# Patient Record
Sex: Male | Born: 1955 | Race: White | Hispanic: No | Marital: Married | Smoking: Former smoker
Health system: Southern US, Community
[De-identification: ages and names within clinical notes are randomized; demographics above are authoritative.]

## PROBLEM LIST (undated history)

## (undated) DIAGNOSIS — S8292XA Unspecified fracture of left lower leg, initial encounter for closed fracture: Secondary | ICD-10-CM

## (undated) DIAGNOSIS — I10 Essential (primary) hypertension: Secondary | ICD-10-CM

## (undated) DIAGNOSIS — K429 Umbilical hernia without obstruction or gangrene: Secondary | ICD-10-CM

## (undated) DIAGNOSIS — E785 Hyperlipidemia, unspecified: Secondary | ICD-10-CM

## (undated) DIAGNOSIS — R011 Cardiac murmur, unspecified: Secondary | ICD-10-CM

## (undated) HISTORY — PX: APPENDECTOMY: SHX54

## (undated) HISTORY — DX: Hyperlipidemia, unspecified: E78.5

## (undated) HISTORY — DX: Cardiac murmur, unspecified: R01.1

## (undated) HISTORY — DX: Essential (primary) hypertension: I10

## (undated) HISTORY — DX: Unspecified fracture of left lower leg, initial encounter for closed fracture: S82.92XA

## (undated) HISTORY — DX: Umbilical hernia without obstruction or gangrene: K42.9

---

## 2010-11-21 ENCOUNTER — Encounter: Payer: Self-pay | Admitting: Cardiology

## 2010-11-21 ENCOUNTER — Ambulatory Visit: Payer: Self-pay | Admitting: Cardiology

## 2010-12-21 ENCOUNTER — Encounter: Payer: Self-pay | Admitting: Cardiology

## 2011-04-17 ENCOUNTER — Ambulatory Visit: Payer: Self-pay | Admitting: Cardiology

## 2011-04-30 ENCOUNTER — Encounter: Payer: Self-pay | Admitting: Cardiology

## 2011-05-01 ENCOUNTER — Ambulatory Visit (INDEPENDENT_AMBULATORY_CARE_PROVIDER_SITE_OTHER): Payer: Private Health Insurance - Indemnity | Admitting: Cardiology

## 2011-05-01 ENCOUNTER — Encounter: Payer: Self-pay | Admitting: Cardiology

## 2011-05-01 VITALS — BP 210/81 | HR 71 | Resp 18 | Ht 70.0 in | Wt 259.1 lb

## 2011-05-01 DIAGNOSIS — I779 Disorder of arteries and arterioles, unspecified: Secondary | ICD-10-CM | POA: Insufficient documentation

## 2011-05-01 DIAGNOSIS — R0989 Other specified symptoms and signs involving the circulatory and respiratory systems: Secondary | ICD-10-CM

## 2011-05-01 DIAGNOSIS — I10 Essential (primary) hypertension: Secondary | ICD-10-CM

## 2011-05-01 DIAGNOSIS — R011 Cardiac murmur, unspecified: Secondary | ICD-10-CM

## 2011-05-01 DIAGNOSIS — E782 Mixed hyperlipidemia: Secondary | ICD-10-CM

## 2011-05-01 NOTE — Assessment & Plan Note (Signed)
Long-standing by patient report, not treated medically until just recently. We discussed diet, weight loss, sodium restriction. He reports compliance with recent addition of Norvasc. We will follow up on his blood pressure at next visit.

## 2011-05-01 NOTE — Assessment & Plan Note (Signed)
Reportedly long-standing, although has not had regular followup or a prior echocardiogram. No definite symptoms or functional limitations from a cardiac perspective. He does have significant hypertension and probable LVH, could be a flow murmur. Plan will be to arrange a formal echocardiogram for further evaluation.

## 2011-05-01 NOTE — Patient Instructions (Signed)
Follow up as scheduled. Your physician recommends that you continue on your current medications as directed. Please refer to the Current Medication list given to you today. Your physician has requested that you have an echocardiogram. Echocardiography is a painless test that uses sound waves to create images of your heart. It provides your doctor with information about the size and shape of your heart and how well your heart's chambers and valves are working. This procedure takes approximately one hour. There are no restrictions for this procedure. Your physician has requested that you have a carotid duplex. This test is an ultrasound of the carotid arteries in your neck. It looks at blood flow through these arteries that supply the brain with blood. Allow one hour for this exam. There are no restrictions or special instructions.

## 2011-05-01 NOTE — Progress Notes (Signed)
   Clinical Summary Eric Vincent is a 56 y.o.male referred for cardiology consultation by Dr. Christell Vincent for evaluation of a heart murmur. He has had no regular medical followup for 30 years, recently established primary care evaluation, placed on antihypertensive as well as cholesterol-lowering medication. Main complaint in January was of a gout flare.  Recent lab work from January showed hemoglobin 16.2, platelets 270, potassium 4.2, BUN 21, creatinine 1.0, AST 34, ALT 58, cholesterol 230, nitroglycerin 121, HDL 37, LDL 169, uric acid 8.0.  He remembers being told that he had a heart murmur as a child, however states that this ultimately resolved. He has never had an echocardiogram. He reports no exertional chest pain or palpitations, describes NYHA class II dyspnea on exertion. No syncope. He states he sometimes gets "lightheaded" when he gets emotionally upset. This has been less of a problem recently.  He reports compliance with his recent medications. Also takes an aspirin daily.   Allergies  Allergen Reactions  . Tylenol Arthritis Ext (Acetaminophen) Rash    The capsule type makes pt breakout into a rash all over.    Current Outpatient Prescriptions  Medication Sig Dispense Refill  . allopurinol (ZYLOPRIM) 100 MG tablet Take 100 mg by mouth daily.        Marland Kitchen AMLODIPINE BESYLATE PO Take 10 mg by mouth daily.      . colchicine 0.6 MG tablet Take 0.6 mg by mouth daily.        . rosuvastatin (CRESTOR) 10 MG tablet Take 10 mg by mouth daily.        Past Medical History  Diagnosis Date  . Gout   . Essential hypertension, benign     Longstanding - untreated  . Hyperlipidemia     Recently diagnosed  . Systolic murmur     Diagnosed childhood  . Leg fracture, left   . Umbilical hernia     Past Surgical History  Procedure Date  . Appendectomy     Family History  Problem Relation Age of Onset  . Hypertension Mother   . Hypertension Father   . Hypertension Brother   . Anuerysm Father       Brain    Social History Eric Vincent reports that he has quit smoking. His smoking use included Cigarettes. He has never used smokeless tobacco. Eric Vincent reports that he does not drink alcohol.  Review of Systems As outlined above. No pain from umbilical hernia. No orthopnea or PND. No focal weakness or speech deficits. Has had chronic left leg pain following a fracture many years ago. Also occasionally swelling on that side. Otherwise negative.  Physical Examination Filed Vitals:   05/01/11 0848  BP: 210/81  Pulse: 71  Resp: 18   Morbidly obese male in no acute distress. HEENT: Conjunctiva and lids normal, oropharynx clear with poor dentition.. Neck: Supple, no elevated JVP, bilateral carotid bruits, no thyromegaly. Lungs: Clear to auscultation, nonlabored breathing at rest. Cardiac: Regular rate and rhythm, no S3, 2/6 systolic murmur heard throughout precordium, no pericardial rub. Abdomen: Protuberant, soft, nontender, umbilical hernia noted - large and nontender, bowel sounds present, no guarding or rebound. Extremities: Mild edema on left, distal pulses 2+. Skin: Warm and dry. Musculoskeletal: No kyphosis. Neuropsychiatric: Alert and oriented x3, affect grossly appropriate.   ECG Sinus rhythm at 61 with left atrial enlargement, LVH, nonspecific ST-T changes with anteroseptal and high lateral T-wave inversions.    Problem List and Plan

## 2011-05-01 NOTE — Assessment & Plan Note (Signed)
Weight loss was discussed  

## 2011-05-01 NOTE — Assessment & Plan Note (Signed)
Carotid Dopplers to be obtained as well. He is on aspirin daily.

## 2011-05-01 NOTE — Assessment & Plan Note (Signed)
Recently started on Crestor 

## 2011-05-15 ENCOUNTER — Other Ambulatory Visit: Payer: Private Health Insurance - Indemnity

## 2011-05-23 ENCOUNTER — Encounter (INDEPENDENT_AMBULATORY_CARE_PROVIDER_SITE_OTHER): Payer: Private Health Insurance - Indemnity

## 2011-05-23 DIAGNOSIS — R0989 Other specified symptoms and signs involving the circulatory and respiratory systems: Secondary | ICD-10-CM

## 2011-05-23 DIAGNOSIS — I6529 Occlusion and stenosis of unspecified carotid artery: Secondary | ICD-10-CM

## 2011-05-23 MED ORDER — LISINOPRIL-HYDROCHLOROTHIAZIDE 20-25 MG PO TABS: 1.0000 | ORAL_TABLET | Freq: Every morning | ORAL | Status: DC

## 2011-05-23 NOTE — Patient Instructions (Signed)
   Begin Lisinopril / HCTZ 20/25mg  every morning  Continue Norvasc every evening Your physician recommends that you go to the [facility] for lab work for [employer] prior to already scheduled office visit.

## 2011-05-29 ENCOUNTER — Other Ambulatory Visit: Payer: Self-pay

## 2011-05-29 ENCOUNTER — Ambulatory Visit (INDEPENDENT_AMBULATORY_CARE_PROVIDER_SITE_OTHER): Payer: Private Health Insurance - Indemnity | Admitting: *Deleted

## 2011-05-29 ENCOUNTER — Telehealth: Payer: Self-pay | Admitting: *Deleted

## 2011-05-29 ENCOUNTER — Other Ambulatory Visit (INDEPENDENT_AMBULATORY_CARE_PROVIDER_SITE_OTHER): Payer: Private Health Insurance - Indemnity

## 2011-05-29 VITALS — BP 203/89 | HR 65

## 2011-05-29 DIAGNOSIS — I10 Essential (primary) hypertension: Secondary | ICD-10-CM

## 2011-05-29 DIAGNOSIS — R011 Cardiac murmur, unspecified: Secondary | ICD-10-CM

## 2011-05-29 NOTE — Telephone Encounter (Signed)
Dr. Ival Bible recommendation following BP check this am.    Nona Dell, MD 05/29/2011 10:28 AM Pended  Continue same regimen for now, and arrange office followup for further titration.

## 2011-05-29 NOTE — Telephone Encounter (Signed)
No working number in Audiological scientist system for pt. Called [facility]-Dr. Vinicius Brockman's office to verify correct phone number. [phone]  This is correct number for pt. Pt's home and cell numbers have been corrected in Epic.   Pt notified of results of carotid doppler and Dr. Ival Bible review of nurse visit and verbalized understanding.

## 2011-05-29 NOTE — Progress Notes (Signed)
Pt presents to office for Echo today. He was seen last week for a Carotid doppler. At that time, he was found to have elevated BP and Lisinopril/HCTZ was added to pt's medications. BP remains elevated today. Pt took medications about 1 1/2 hours ago.

## 2011-05-29 NOTE — Progress Notes (Signed)
Pt notified and verbalized understanding.

## 2011-05-29 NOTE — Telephone Encounter (Signed)
Message copied by Arlyss Gandy on Wed May 29, 2011  3:39 PM ------      Message from: MCDOWELL, Illene Bolus      Created: Wed May 29, 2011 11:55 AM       Reviewed report. 40-59% bilateral ICA stenosis noted. Can continue medical therapy at this point.

## 2011-05-29 NOTE — Progress Notes (Signed)
Continue same regimen for now, and arrange office followup for further titration.

## 2011-06-05 ENCOUNTER — Ambulatory Visit (INDEPENDENT_AMBULATORY_CARE_PROVIDER_SITE_OTHER): Payer: Private Health Insurance - Indemnity | Admitting: Cardiology

## 2011-06-05 ENCOUNTER — Encounter: Payer: Self-pay | Admitting: Cardiology

## 2011-06-05 VITALS — BP 208/83 | HR 78 | Ht 70.0 in | Wt 252.0 lb

## 2011-06-05 DIAGNOSIS — R0989 Other specified symptoms and signs involving the circulatory and respiratory systems: Secondary | ICD-10-CM

## 2011-06-05 DIAGNOSIS — R011 Cardiac murmur, unspecified: Secondary | ICD-10-CM

## 2011-06-05 DIAGNOSIS — E782 Mixed hyperlipidemia: Secondary | ICD-10-CM

## 2011-06-05 DIAGNOSIS — I10 Essential (primary) hypertension: Secondary | ICD-10-CM

## 2011-06-05 MED ORDER — LISINOPRIL 40 MG PO TABS: 40.0000 mg | ORAL_TABLET | Freq: Every day | ORAL | Status: DC

## 2011-06-05 MED ORDER — HYDROCHLOROTHIAZIDE 25 MG PO TABS: 25.0000 mg | ORAL_TABLET | Freq: Every day | ORAL | Status: DC

## 2011-06-05 NOTE — Assessment & Plan Note (Signed)
No major regurgitant or stenotic valvular abnormalities. Does have mild thickening of the aortic and mitral valves.

## 2011-06-05 NOTE — Assessment & Plan Note (Signed)
Nonobstructive disease. Continue statin therapy and observation. Can followup Dopplers in approximately one year.

## 2011-06-05 NOTE — Assessment & Plan Note (Signed)
Continue to work on diet, sodium restriction, weight loss. Increase lisinopril to 40 mg daily, continue current dose of HCTZ and Norvasc. He will be following up with his primary care provider later this month, and we will see him back in the office as well. May need further medication titration.

## 2011-06-05 NOTE — Patient Instructions (Signed)
Follow up in 6 weeks. Stop Zestoretic (lisinopril/HCTZ combination pill). Start Lisinopril 40 mg and HCTZ 25 mg daily.

## 2011-06-05 NOTE — Assessment & Plan Note (Signed)
Patient continues on Crestor.

## 2011-06-05 NOTE — Progress Notes (Signed)
   Clinical Summary Eric Vincent is a 56 y.o.male presenting for followup. He was in February for evaluation of a cardiac murmur, also noted to be placed on medical therapy recently for hypertension, findings of carotid bruits on exam.  Echocardiogram revealed LVEF of 60-65% with moderate LVH, grade 1 diastolic dysfunction, and thickening of the aortic and mitral valve although without any stenotic or regurgitant lesions. Carotid Doppler showed 40-59% bilateral ICA stenoses. We reviewed the studies today.  His blood pressure was also noted to be significantly elevated in the interim prompting addition of medical treatment. Followup lab work on 4/1 showed BUN 34, creatinine 1.1, and potassium 4.2.  He is here with his wife today. Blood pressure is still not well controlled. He reports tolerating his current medications. He states that he has been working on diet and weight loss, has lost 7 pounds since last visit.   Allergies  Allergen Reactions  . Tylenol Arthritis Ext (Acetaminophen) Rash    The capsule type makes pt breakout into a rash all over.    Current Outpatient Prescriptions  Medication Sig Dispense Refill  . allopurinol (ZYLOPRIM) 100 MG tablet Take 100 mg by mouth daily.        Marland Kitchen AMLODIPINE BESYLATE PO Take 10 mg by mouth every evening.       . colchicine 0.6 MG tablet Take 0.6 mg by mouth daily.        . rosuvastatin (CRESTOR) 10 MG tablet Take 10 mg by mouth daily.      . hydrochlorothiazide (HYDRODIURIL) 25 MG tablet Take 1 tablet (25 mg total) by mouth daily.  30 tablet  6  . lisinopril (PRINIVIL,ZESTRIL) 40 MG tablet Take 1 tablet (40 mg total) by mouth daily.  30 tablet  6    Past Medical History  Diagnosis Date  . Gout   . Essential hypertension, benign     Longstanding - untreated  . Hyperlipidemia     Recently diagnosed  . Systolic murmur     Diagnosed childhood  . Leg fracture, left   . Umbilical hernia     Social History [name] reports that he has quit  smoking. His smoking use included Cigarettes. He has never used smokeless tobacco. Mr. Kohles reports that he does not drink alcohol.  Review of Systems Otherwise reviewed and negative.  Physical Examination Filed Vitals:   06/05/11 1400  BP: 208/83  Pulse: 78   Morbidly obese male in no acute distress.  HEENT: Conjunctiva and lids normal, oropharynx clear with poor dentition..  Neck: Supple, no elevated JVP, bilateral carotid bruits, no thyromegaly.  Lungs: Clear to auscultation, nonlabored breathing at rest.  Cardiac: Regular rate and rhythm, no S3, 2/6 systolic murmur heard throughout precordium, no pericardial rub.  Abdomen: Protuberant, soft, nontender, umbilical hernia noted - large and nontender, bowel sounds present, no guarding or rebound.  Extremities: Mild edema on left, distal pulses 2+.  Skin: Warm and dry.  Musculoskeletal: No kyphosis.  Neuropsychiatric: Alert and oriented x3, affect grossly appropriate.    Problem List and Plan

## 2011-07-19 ENCOUNTER — Ambulatory Visit: Payer: Private Health Insurance - Indemnity | Admitting: Cardiology

## 2011-08-30 ENCOUNTER — Encounter: Payer: Self-pay | Admitting: Cardiology

## 2011-08-30 ENCOUNTER — Ambulatory Visit (INDEPENDENT_AMBULATORY_CARE_PROVIDER_SITE_OTHER): Payer: Private Health Insurance - Indemnity | Admitting: Cardiology

## 2011-08-30 VITALS — BP 164/79 | HR 65 | Ht 70.0 in | Wt 263.0 lb

## 2011-08-30 DIAGNOSIS — I10 Essential (primary) hypertension: Secondary | ICD-10-CM

## 2011-08-30 DIAGNOSIS — R0989 Other specified symptoms and signs involving the circulatory and respiratory systems: Secondary | ICD-10-CM

## 2011-08-30 DIAGNOSIS — I779 Disorder of arteries and arterioles, unspecified: Secondary | ICD-10-CM

## 2011-08-30 DIAGNOSIS — E782 Mixed hyperlipidemia: Secondary | ICD-10-CM

## 2011-08-30 DIAGNOSIS — R011 Cardiac murmur, unspecified: Secondary | ICD-10-CM

## 2011-08-30 NOTE — Assessment & Plan Note (Signed)
Consistent with underlying LVH and also aortic sclerosis. No other major valvular abnormalities.

## 2011-08-30 NOTE — Assessment & Plan Note (Signed)
Decrease Crestor to 5 mg daily to see if he tolerates this better. Will request a recent lipid profile from primary care.

## 2011-08-30 NOTE — Assessment & Plan Note (Signed)
Mild to moderate. Continue aspirin, statin therapy, followup carotid Doppler for next visit in 6 months.

## 2011-08-30 NOTE — Progress Notes (Signed)
Clinical Summary Eric Vincent is a 56 y.o.male presenting for followup. He was seen back in February. He states that he feels better in general. He has gained some weight, and we discussed diet measures and also regular exercise. He reports compliance with his medications, and his blood pressure trend is looking better.  Echocardiogram from March showed moderate LVH with LVEF of 60-65%, grade 1 diastolic dysfunction, mildly thickened aortic valve with no major abnormalities. Carotid Doppler showed 40-59% bilateral ICA stenoses. LDL was 169 back in January. We reviewed this today.  He states he has had some "achiness" on Crestor. I suggested that he might see if he can tolerate 5 mg daily instead. Will also request his lipid profile from [facility].  Also reports intermittent leg cramps, not claudication however.   Allergies  Allergen Reactions  . Tylenol Arthritis Ext (Acetaminophen) Rash    The capsule type makes pt breakout into a rash all over.    Current Outpatient Prescriptions  Medication Sig Dispense Refill  . allopurinol (ZYLOPRIM) 100 MG tablet Take 100 mg by mouth daily.        Marland Kitchen AMLODIPINE BESYLATE PO Take 10 mg by mouth every evening.       Marland Kitchen aspirin EC 81 MG tablet Take 81 mg by mouth daily.      . colchicine 0.6 MG tablet Take 0.6 mg by mouth daily.        . hydrochlorothiazide (HYDRODIURIL) 25 MG tablet Take 1 tablet (25 mg total) by mouth daily.  30 tablet  6  . lisinopril (PRINIVIL,ZESTRIL) 40 MG tablet Take 1 tablet (40 mg total) by mouth daily.  30 tablet  6  . rosuvastatin (CRESTOR) 10 MG tablet Take 10 mg by mouth daily.        Past Medical History  Diagnosis Date  . Gout   . Essential hypertension, benign     Longstanding - untreated  . Hyperlipidemia     Recently diagnosed  . Systolic murmur     Diagnosed childhood  . Leg fracture, left   . Umbilical hernia     Social History Eric Vincent reports that he has quit smoking. His smoking  use included Cigarettes. He has never used smokeless tobacco. Eric Vincent reports that he does not drink alcohol.  Review of Systems Negative except as outlined above.  Physical Examination Filed Vitals:   08/30/11 0832  BP: 164/79  Pulse: 65    Morbidly obese male in no acute distress.  HEENT: Conjunctiva and lids normal, oropharynx clear with poor dentition..  Neck: Supple, no elevated JVP, bilateral carotid bruits, no thyromegaly.  Lungs: Clear to auscultation, nonlabored breathing at rest.  Cardiac: Regular rate and rhythm, no S3, 2/6 systolic murmur heard throughout precordium, no pericardial rub.  Abdomen: Protuberant, soft, nontender, umbilical hernia noted - large and nontender, bowel sounds present, no guarding or rebound.  Extremities: Mild edema on left, distal pulses 2+.  Skin: Warm and dry.  Musculoskeletal: No kyphosis.  Neuropsychiatric: Alert and oriented x3, affect grossly appropriate.    Problem List and Plan   Undiagnosed cardiac murmurs Consistent with underlying LVH and also aortic sclerosis. No other major valvular abnormalities.  Essential hypertension, benign Blood pressure trend is improving on medical therapy. Continue to focus on compliance, sodium restriction, diet and exercise. Check BMET to make sure he is not developing hypokalemia on diuretic.  Carotid artery disease Mild to moderate. Continue aspirin, statin therapy, followup carotid Doppler for next visit in  6 months.  Mixed hyperlipidemia Decrease Crestor to 5 mg daily to see if he tolerates this better. Will request a recent lipid profile from primary care.    Jonelle Sidle, M.D., F.A.C.C.

## 2011-08-30 NOTE — Patient Instructions (Addendum)
Your physician recommends that you schedule a follow-up appointment in: 6 months with Dr. Diona Browner. You will receive a reminder letter in the mail in about 4 months reminding you to call and schedule your appointment. If you don't receive this letter, please contact our office.   Your physician recommends that you continue on your current medications as directed. Please refer to the Current Medication list given to you today.   Your physician has requested that you have a carotid duplex in 6 months just before your next appointment. This test is an ultrasound of the carotid arteries in your neck. It looks at blood flow through these arteries that supply the brain with blood. Allow one hour for this exam. There are no restrictions or special instructions.   Your physician recommends that you return for lab work at [facility] for BMET. Please have results sent to our office.  We have requested your last lab work form [facility].

## 2011-08-30 NOTE — Assessment & Plan Note (Addendum)
Blood pressure trend is improving on medical therapy. Continue to focus on compliance, sodium restriction, diet and exercise. Check BMET to make sure he is not developing hypokalemia on diuretic.

## 2011-09-04 ENCOUNTER — Other Ambulatory Visit: Payer: Self-pay

## 2011-09-13 ENCOUNTER — Telehealth: Payer: Self-pay | Admitting: *Deleted

## 2011-09-13 NOTE — Telephone Encounter (Signed)
Labs reviewed, K 5.3 (upper limit of NL); renal fxn stable. Please ensure pt is not taking any supplemental K. No further workup. ----- Message ----- From: Prescott Parma, PA Sent: 09/13/2011 11:45 AM To: Prescott Parma, PA  Made attempt to call patient and home phone just rang and cell phone went to voicemail and unable to leave message saying voicemail hasn't been set up yet. Will retry later.

## 2011-09-17 NOTE — Telephone Encounter (Signed)
Patient informed and isn't taking any K+.

## 2011-12-02 ENCOUNTER — Other Ambulatory Visit: Payer: Self-pay | Admitting: Cardiology

## 2011-12-02 MED ORDER — HYDROCHLOROTHIAZIDE 25 MG PO TABS: 25.0000 mg | ORAL_TABLET | Freq: Every day | ORAL | Status: DC

## 2011-12-30 ENCOUNTER — Other Ambulatory Visit: Payer: Self-pay | Admitting: *Deleted

## 2011-12-30 MED ORDER — LISINOPRIL 40 MG PO TABS: 40.0000 mg | ORAL_TABLET | Freq: Every day | ORAL | Status: DC

## 2012-05-01 ENCOUNTER — Ambulatory Visit (INDEPENDENT_AMBULATORY_CARE_PROVIDER_SITE_OTHER): Payer: BC Managed Care – PPO | Admitting: Cardiology

## 2012-05-01 ENCOUNTER — Encounter: Payer: Self-pay | Admitting: Cardiology

## 2012-05-01 VITALS — BP 162/77 | HR 74 | Ht 70.0 in | Wt 277.0 lb

## 2012-05-01 DIAGNOSIS — E782 Mixed hyperlipidemia: Secondary | ICD-10-CM

## 2012-05-01 DIAGNOSIS — I779 Disorder of arteries and arterioles, unspecified: Secondary | ICD-10-CM

## 2012-05-01 DIAGNOSIS — I10 Essential (primary) hypertension: Secondary | ICD-10-CM

## 2012-05-01 NOTE — Assessment & Plan Note (Signed)
He is on a reasonable medical regimen now. Continue to stress weight loss and sodium restriction, also exercise. Keep follow up with primary care.

## 2012-05-01 NOTE — Assessment & Plan Note (Signed)
He would like to hold off on followup Dopplers now, will discuss again in 6 months. Otherwise continue aspirin and statin therapy.

## 2012-05-01 NOTE — Patient Instructions (Addendum)

## 2012-05-01 NOTE — Assessment & Plan Note (Signed)
Continues on Crestor. Goal LDL would be under 100 optimally. Keep followup with primary care.

## 2012-05-01 NOTE — Progress Notes (Signed)
Clinical Summary Mr. Ballen is a 57 y.o.male presenting for followup. He was seen in June 2013. He is here with his wife today. He reports compliance with his medications, states that his cholesterol numbers have come down significantly, followed by primary care.  He is due for followup carotid Dopplers - he wanted to hold off on doing this related to finances. Lab work from July 2013 showed sodium 139, potassium 5.3, BUN 27, creatinine 1.2. ECG today shows sinus rhythm with LVH repolarization abnormalities.  Today we discussed diet and weight loss, sodium restriction, also trying to maintain a basic exercise regimen.    Allergies  Allergen Reactions  . Tylenol Arthritis Ext (Acetaminophen) Rash    The capsule type makes pt breakout into a rash all over.    Current Outpatient Prescriptions  Medication Sig Dispense Refill  . allopurinol (ZYLOPRIM) 100 MG tablet Take 100 mg by mouth daily.        Marland Kitchen AMLODIPINE BESYLATE PO Take 10 mg by mouth every evening.       Marland Kitchen aspirin EC 81 MG tablet Take 81 mg by mouth daily.      . colchicine 0.6 MG tablet Take 0.6 mg by mouth daily.        . fluticasone (FLONASE) 50 MCG/ACT nasal spray Place 1 spray into the nose daily.       . hydrochlorothiazide (HYDRODIURIL) 25 MG tablet Take 1 tablet (25 mg total) by mouth daily.  30 tablet  6  . lisinopril (PRINIVIL,ZESTRIL) 40 MG tablet Take 1 tablet (40 mg total) by mouth daily.  30 tablet  6  . rosuvastatin (CRESTOR) 10 MG tablet Take 10 mg by mouth daily.       No current facility-administered medications for this visit.    Past Medical History  Diagnosis Date  . Gout   . Essential hypertension, benign     Longstanding - untreated  . Hyperlipidemia     Recently diagnosed  . Systolic murmur     LVH and aortic sclerosis  . Leg fracture, left   . Umbilical hernia     Social History Mr. Brumett reports that he has quit smoking. His smoking use included Cigarettes. He smoked 0.00 packs per day. He has  never used smokeless tobacco. Mr. Lebow reports that he does not drink alcohol.  Review of Systems No palpitations, no claudication. Complains of chronic back pain. Mild leg edema. Otherwise negative.  Physical Examination Filed Vitals:   05/01/12 0847  BP: 162/77  Pulse: 74   Filed Weights   05/01/12 0847  Weight: 277 lb (125.646 kg)    Morbidly obese male in no acute distress.  HEENT: Conjunctiva and lids normal, oropharynx clear with poor dentition..  Neck: Supple, no elevated JVP, bilateral carotid bruits, no thyromegaly.  Lungs: Clear to auscultation, nonlabored breathing at rest.  Cardiac: Regular rate and rhythm, no S3, 2/6 systolic murmur heard throughout precordium, no pericardial rub.  Abdomen: Protuberant, soft, nontender, umbilical hernia noted - large and nontender, bowel sounds present, no guarding or rebound.  Extremities: Mild edema, distal pulses 2+.     Problem List and Plan   Essential hypertension, benign He is on a reasonable medical regimen now. Continue to stress weight loss and sodium restriction, also exercise. Keep follow up with primary care.  Mixed hyperlipidemia Continues on Crestor. Goal LDL would be under 100 optimally. Keep followup with primary care.  Carotid artery disease He would like to hold off on followup Dopplers now,  will discuss again in 6 months. Otherwise continue aspirin and statin therapy.    Jonelle Sidle, M.D., F.A.C.C.

## 2012-06-03 ENCOUNTER — Other Ambulatory Visit: Payer: Self-pay | Admitting: *Deleted

## 2012-06-03 MED ORDER — FLUTICASONE PROPIONATE 50 MCG/ACT NA SUSP: 1.0000 | Freq: Two times a day (BID) | NASAL | Status: AC

## 2012-07-30 ENCOUNTER — Other Ambulatory Visit: Payer: Self-pay | Admitting: *Deleted

## 2012-07-30 MED ORDER — HYDROCHLOROTHIAZIDE 25 MG PO TABS: 25.0000 mg | ORAL_TABLET | Freq: Every day | ORAL | Status: DC

## 2012-09-22 ENCOUNTER — Other Ambulatory Visit: Payer: Self-pay | Admitting: *Deleted

## 2012-09-22 MED ORDER — ALLOPURINOL 100 MG PO TABS: 100.0000 mg | ORAL_TABLET | Freq: Every day | ORAL | Status: AC

## 2012-12-17 ENCOUNTER — Other Ambulatory Visit: Payer: Self-pay | Admitting: *Deleted

## 2012-12-17 ENCOUNTER — Telehealth: Payer: Self-pay | Admitting: Cardiology

## 2012-12-17 NOTE — Telephone Encounter (Signed)
Left message for patient to call so that we can schedule Carotid

## 2012-12-17 NOTE — Telephone Encounter (Signed)
Message copied by Geraldine Contras on Thu Dec 17, 2012 11:39 AM ------      Message from: San Jetty      Created: Wed Dec 09, 2012 10:40 AM       THIS PATIENT IS ON EXPIRED REMINDER RECALL             PT NEEDS SET UP IN [place] FOR F/U WITH MCDOWELL AND CAROTID ULTRASOUND PER REMINDER. THANKS!            Sorry about caps lol ------

## 2013-01-05 ENCOUNTER — Other Ambulatory Visit: Payer: Self-pay

## 2013-01-05 MED ORDER — AMLODIPINE BESYLATE 10 MG PO TABS: 10.0000 mg | ORAL_TABLET | Freq: Every day | ORAL | Status: AC

## 2013-01-05 NOTE — Telephone Encounter (Signed)
Last seen 03/31/12  acm

## 2013-02-01 ENCOUNTER — Other Ambulatory Visit: Payer: Self-pay | Admitting: Cardiology

## 2013-02-01 MED ORDER — LISINOPRIL 40 MG PO TABS: 40.0000 mg | ORAL_TABLET | Freq: Every day | ORAL | Status: AC

## 2013-02-17 ENCOUNTER — Ambulatory Visit: Payer: Self-pay | Admitting: Family Medicine

## 2013-03-01 ENCOUNTER — Other Ambulatory Visit: Payer: Self-pay | Admitting: Cardiology

## 2013-03-01 MED ORDER — HYDROCHLOROTHIAZIDE 25 MG PO TABS: 25.0000 mg | ORAL_TABLET | Freq: Every day | ORAL | Status: AC

## 2013-04-22 ENCOUNTER — Encounter: Payer: Self-pay | Admitting: Cardiology

## 2013-04-22 NOTE — Progress Notes (Signed)
No show  This encounter was created in error - please disregard.

## 2013-04-23 ENCOUNTER — Encounter: Payer: BC Managed Care – PPO | Admitting: Cardiology

## 2015-04-10 ENCOUNTER — Ambulatory Visit (INDEPENDENT_AMBULATORY_CARE_PROVIDER_SITE_OTHER): Payer: Worker's Compensation | Admitting: Family Medicine

## 2015-04-10 ENCOUNTER — Ambulatory Visit (INDEPENDENT_AMBULATORY_CARE_PROVIDER_SITE_OTHER): Payer: Worker's Compensation

## 2015-04-10 ENCOUNTER — Encounter: Payer: Self-pay | Admitting: Family Medicine

## 2015-04-10 VITALS — BP 154/79 | HR 76 | Temp 96.9°F | Ht 70.0 in | Wt 292.0 lb

## 2015-04-10 DIAGNOSIS — M25511 Pain in right shoulder: Secondary | ICD-10-CM

## 2015-04-10 NOTE — Progress Notes (Signed)
BP 154/79 mmHg  Pulse 76  Temp(Src) 96.9 F (36.1 C) (Oral)  Ht  (1.778 m)  Wt 292 lb (132.45 kg)  BMI 41.90 kg/m2   Subjective:    Patient ID: Eric Vincent, male    DOB: 1956/01/12, 60 y.o.   MRN: [id number]  HPI: Eric Vincent is a 60 y.o. male presenting on 04/10/2015 for Shoulder Injury   HPI Right shoulder pain Patient is coming in today for worker's comp visit from [employer]. The accident happened today. He was walking and caught his panel leg on the side of the palate and fell down and landed on his right shoulder and side of his body. His pain is on the posterior aspect of his right shoulder just inferior to the acromion process. He is having difficulty raising his arm up over his head because of the pain. He also has pain when trying to lift objects with that arm. He denies any numbness or weakness going down into his arm.  Relevant past medical, surgical, family and social history reviewed and updated as indicated. Interim medical history since our last visit reviewed. Allergies and medications reviewed and updated.  Review of Systems  Constitutional: Negative for fever and chills.  HENT: Negative for ear discharge and ear pain.   Eyes: Negative for discharge and visual disturbance.  Respiratory: Negative for shortness of breath and wheezing.   Cardiovascular: Negative for chest pain and leg swelling.  Gastrointestinal: Negative for abdominal pain, diarrhea and constipation.  Genitourinary: Negative for difficulty urinating.  Musculoskeletal: Positive for arthralgias. Negative for back pain, joint swelling and gait problem.  Skin: Negative for color change, rash and wound.  Neurological: Negative for syncope, light-headedness and headaches.  All other systems reviewed and are negative.   Per HPI unless specifically indicated above     Medication List       This list is accurate as of: 04/10/15  3:29 PM.  Always use your most recent med list.                 allopurinol 100 MG tablet  Commonly known as:  ZYLOPRIM  Take 1 tablet (100 mg total) by mouth daily.     AMLODIPINE BESYLATE PO  Take 10 mg by mouth every evening.     amLODipine 10 MG tablet  Commonly known as:  NORVASC  Take 1 tablet (10 mg total) by mouth daily.     aspirin EC 81 MG tablet  Take 81 mg by mouth daily.     colchicine 0.6 MG tablet  Take 0.6 mg by mouth daily.     fluticasone 50 MCG/ACT nasal spray  Commonly known as:  FLONASE  Place 1 spray into the nose 2 (two) times daily.     hydrochlorothiazide 25 MG tablet  Commonly known as:  HYDRODIURIL  Take 1 tablet (25 mg total) by mouth daily.     lisinopril 40 MG tablet  Commonly known as:  PRINIVIL,ZESTRIL  Take 1 tablet (40 mg total) by mouth daily.     rosuvastatin 10 MG tablet  Commonly known as:  CRESTOR  Take 10 mg by mouth daily.           Objective:    BP 154/79 mmHg  Pulse 76  Temp(Src) 96.9 F (36.1 C) (Oral)  Ht  (1.778 m)  Wt 292 lb (132.45 kg)  BMI 41.90 kg/m2  Wt Readings from Last 3 Encounters:  04/10/15 292 lb (132.45 kg)  05/01/12 277  lb (125.646 kg)  08/30/11 263 lb (119.296 kg)    Physical Exam  Constitutional: He is oriented to person, place, and time. He appears well-developed and well-nourished. No distress.  Eyes: Conjunctivae and EOM are normal. Pupils are equal, round, and reactive to light. Right eye exhibits no discharge. No scleral icterus.  Cardiovascular: Normal rate, regular rhythm, normal heart sounds and intact distal pulses.   No murmur heard. Pulmonary/Chest: Effort normal and breath sounds normal. No respiratory distress. He has no wheezes.  Musculoskeletal: Normal range of motion. He exhibits no edema.       Right shoulder: He exhibits tenderness. He exhibits normal range of motion (full passive range of motion, pain with active range of motion), no bony tenderness, no swelling, no effusion, no crepitus, no deformity, normal pulse and normal  strength.  Neurological: He is alert and oriented to person, place, and time. Coordination normal.  Skin: Skin is warm and dry. No rash noted. He is not diaphoretic.  Psychiatric: He has a normal mood and affect. His behavior is normal.  Vitals reviewed.  Shoulder x-ray:no signs of acute bony abnormality noted. Await final read by radiologist. No results found for this or any previous visit.    Assessment & Plan:   Problem List Items Addressed This Visit    None    Visit Diagnoses    Acute shoulder pain, right    -  Primary    Fall and trauma at work, worker's comp, check x-ray    Relevant Orders    DG Shoulder Right       Recommend 20 pound limit of pushing with that right arm and 10 pound lifting with that right arm for 1 week and return to me as needed if not improved, also recommend anti-inflammatories for this next week.  Follow up plan: Return if symptoms worsen or fail to improve.  Counseling provided for all of the vaccine components Orders Placed This Encounter  Procedures  . DG Shoulder Right    Arville Care, MD [facility] 04/10/2015, 3:29 PM

## 2016-01-05 ENCOUNTER — Encounter: Payer: Self-pay | Admitting: Family Medicine

## 2016-01-05 ENCOUNTER — Ambulatory Visit (INDEPENDENT_AMBULATORY_CARE_PROVIDER_SITE_OTHER): Payer: Worker's Compensation

## 2016-01-05 ENCOUNTER — Ambulatory Visit (INDEPENDENT_AMBULATORY_CARE_PROVIDER_SITE_OTHER): Payer: Worker's Compensation | Admitting: Family Medicine

## 2016-01-05 VITALS — BP 193/97 | HR 70 | Temp 98.0°F | Ht 70.0 in | Wt 286.0 lb

## 2016-01-05 DIAGNOSIS — M25511 Pain in right shoulder: Secondary | ICD-10-CM

## 2016-01-05 DIAGNOSIS — I10 Essential (primary) hypertension: Secondary | ICD-10-CM

## 2016-01-05 MED ORDER — MELOXICAM 15 MG PO TABS: 15.0000 mg | ORAL_TABLET | Freq: Every day | ORAL | 1 refills | Status: DC

## 2016-01-05 NOTE — Patient Instructions (Signed)
The patient should use warm wet compresses to the right shoulder 20 minutes 3 or 4 times daily While at work he should keep his arm in a sling so he will not be using it When he comes from he can take it out of the sling. We will ask him to take meloxicam 15 mg daily after eating for 5 days and then reduce it to 7-1/2 mg daily If it causes his stomach to hurt are elevates his blood pressure he should stop the medicine. We will call with the x-ray results as soon as they become available He should call us next WednesdaKoreay with his progress and at that time we may schedule him for some physical therapy next-door. Next Wednesday he should call Marijean NiemannJaime my nurse.

## 2016-01-05 NOTE — Progress Notes (Addendum)
Subjective:    Patient ID: Eric Vincent, male    DOB: 05-01-1955, 60 y.o.   MRN: [id number]  HPI Patient here today for continued right shoulder pain from a worker's Comp injury back in February of 2017.Shoulder got quite a bit better but the pain never went away. About 2 weeks ago he lifted a box and the right shoulder started hurting improved he severely again. He has limited range of motion especially with abduction. The patient does have a history of hypertension and there is no history of peptic ulcer disease. He is able to check his blood pressures at home.    Patient Active Problem List   Diagnosis Date Noted  . Essential hypertension, benign 05/01/2011  . Carotid artery disease (HCC) 05/01/2011  . Mixed hyperlipidemia 05/01/2011  . Morbid obesity (HCC) 05/01/2011   Outpatient Encounter Prescriptions as of 01/05/2016  Medication Sig  . allopurinol (ZYLOPRIM) 100 MG tablet Take 1 tablet (100 mg total) by mouth daily.  Marland Kitchen. amLODipine (NORVASC) 10 MG tablet Take 1 tablet (10 mg total) by mouth daily.  Marland Kitchen. aspirin EC 81 MG tablet Take 81 mg by mouth daily.  . colchicine 0.6 MG tablet Take 0.6 mg by mouth daily.    . hydrochlorothiazide (HYDRODIURIL) 25 MG tablet Take 1 tablet (25 mg total) by mouth daily.  Marland Kitchen. lisinopril (PRINIVIL,ZESTRIL) 40 MG tablet Take 1 tablet (40 mg total) by mouth daily.  . fluticasone (FLONASE) 50 MCG/ACT nasal spray Place 1 spray into the nose 2 (two) times daily. (Patient not taking: Reported on 01/05/2016)  . [DISCONTINUED] AMLODIPINE BESYLATE PO Take 10 mg by mouth every evening.   . [DISCONTINUED] rosuvastatin (CRESTOR) 10 MG tablet Take 10 mg by mouth daily.   No facility-administered encounter medications on file as of 01/05/2016.       Review of Systems  Constitutional: Negative.   HENT: Negative.   Eyes: Negative.   Respiratory: Negative.   Cardiovascular: Negative.   Gastrointestinal: Negative.   Endocrine: Negative.   Genitourinary: Negative.     Musculoskeletal: Positive for arthralgias (right shoulder pain).  Skin: Negative.   Allergic/Immunologic: Negative.   Neurological: Negative.   Hematological: Negative.   Psychiatric/Behavioral: Negative.        Objective:   Physical Exam  Constitutional: He is oriented to person, place, and time. He appears well-developed and well-nourished. No distress.  Pleasant and alert  HENT:  Head: Normocephalic and atraumatic.  Eyes: Conjunctivae and EOM are normal. Pupils are equal, round, and reactive to light. Right eye exhibits no discharge. Left eye exhibits no discharge.  Neck: Normal range of motion.  Musculoskeletal: He exhibits no edema.  Limited range of motion of right shoulder and unable to raise it 30 without pain. There is tenderness in the anterior right shoulder and some tenderness in the right before meals joint area but more in the right anterior shoulder area over the biceps tendon area.  Neurological: He is alert and oriented to person, place, and time.  Skin: No rash noted.  Psychiatric: He has a normal mood and affect. His behavior is normal. Judgment and thought content normal.  Nursing note and vitals reviewed.  BP (!) 165/86 (BP Location: Left Arm)   Pulse 71   Temp 98 F (36.7 C) (Oral)   Ht 5\' 10"  (1.778 m)   Wt 286 lb (129.7 kg)   BMI 41.04 kg/m   X-ray of right shoulder with results pending      Assessment & Plan:  1. Acute pain of right shoulder -Meloxicam as directed, 8:15 milligrams daily after eating after 5 days reduce it to 7-1/2 mg. -Monitor blood pressure closely -If the medicine bothers her stomach and causes the blood pressure to go up the patient should stop the medicine. -Use warm wet compresses 20 minutes 3 or 4 times daily -Call in 5-6 days and if there is improvement we will consider doing some physical therapy on the shoulder at that time. - DG Shoulder Right; Future   Meds ordered this encounter  Medications  . meloxicam  (MOBIC) 15 MG tablet    Sig: Take 1 tablet (15 mg total) by mouth daily.    Dispense:  30 tablet    Refill:  1   Patient Instructions  The patient should use warm wet compresses to the right shoulder 20 minutes 3 or 4 times daily While at work he should keep his arm in a sling so he will not be using it When he comes from he can take it out of the sling. We will ask him to take meloxicam 15 mg daily after eating for 5 days and then reduce it to 7-1/2 mg daily If it causes his stomach to hurt are elevates his blood pressure he should stop the medicine. We will call with the x-ray results as soon as they become available He should call us next Wednesday with his progress and at that time we may schedule him for some physical therapy next-door. Next Wednesday he should call Marijean NiemannJaime my nurse.   Nyra Capeson W. Aftin Lye MD

## 2016-01-10 ENCOUNTER — Telehealth: Payer: Self-pay | Admitting: Family Medicine

## 2016-01-10 DIAGNOSIS — M25511 Pain in right shoulder: Secondary | ICD-10-CM

## 2016-01-10 NOTE — Telephone Encounter (Signed)
Referred to physical therapy to evaluate and treat

## 2016-01-10 NOTE — Addendum Note (Signed)
Addended by: Magdalene RiverBULLINS, Florentina Marquart H on: 01/10/2016 04:50 PM   Modules accepted: Orders

## 2016-01-10 NOTE — Telephone Encounter (Signed)
We had told pt to contact us in a few days - with progress:  He is still having some issues and you had stated on the last note that we may consider PT  Please let me know your thoughts

## 2016-01-10 NOTE — Telephone Encounter (Signed)
Referral placed and pt aware   

## 2016-01-18 ENCOUNTER — Other Ambulatory Visit: Payer: Self-pay | Admitting: Family Medicine

## 2016-01-19 ENCOUNTER — Other Ambulatory Visit: Payer: Self-pay | Admitting: Family Medicine

## 2016-01-22 ENCOUNTER — Other Ambulatory Visit: Payer: Self-pay | Admitting: Family Medicine

## 2016-03-16 ENCOUNTER — Other Ambulatory Visit: Payer: Self-pay | Admitting: Family Medicine

## 2018-07-17 IMAGING — DX DG SHOULDER 2+V*R*
3 series · 3 of 3 positions shown · non-contrast
Comparison: 04/10/2015

CLINICAL DATA: Right shoulder pain, no known injury, initial
encounter

EXAM:
RIGHT SHOULDER - 2+ VIEW

[shoulder ap]
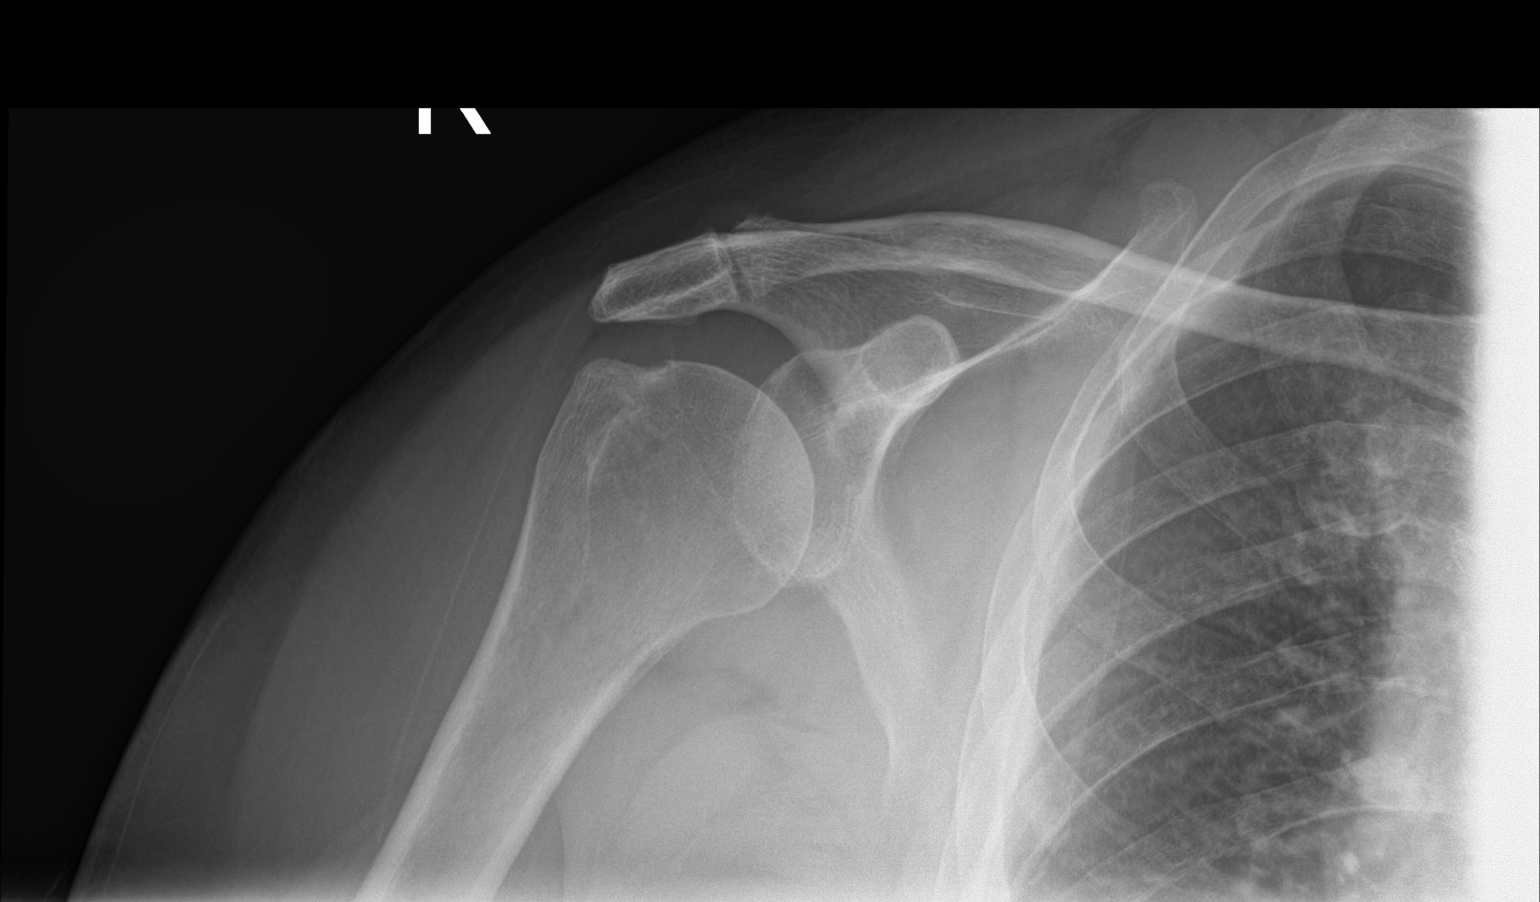

[shoulder obl]
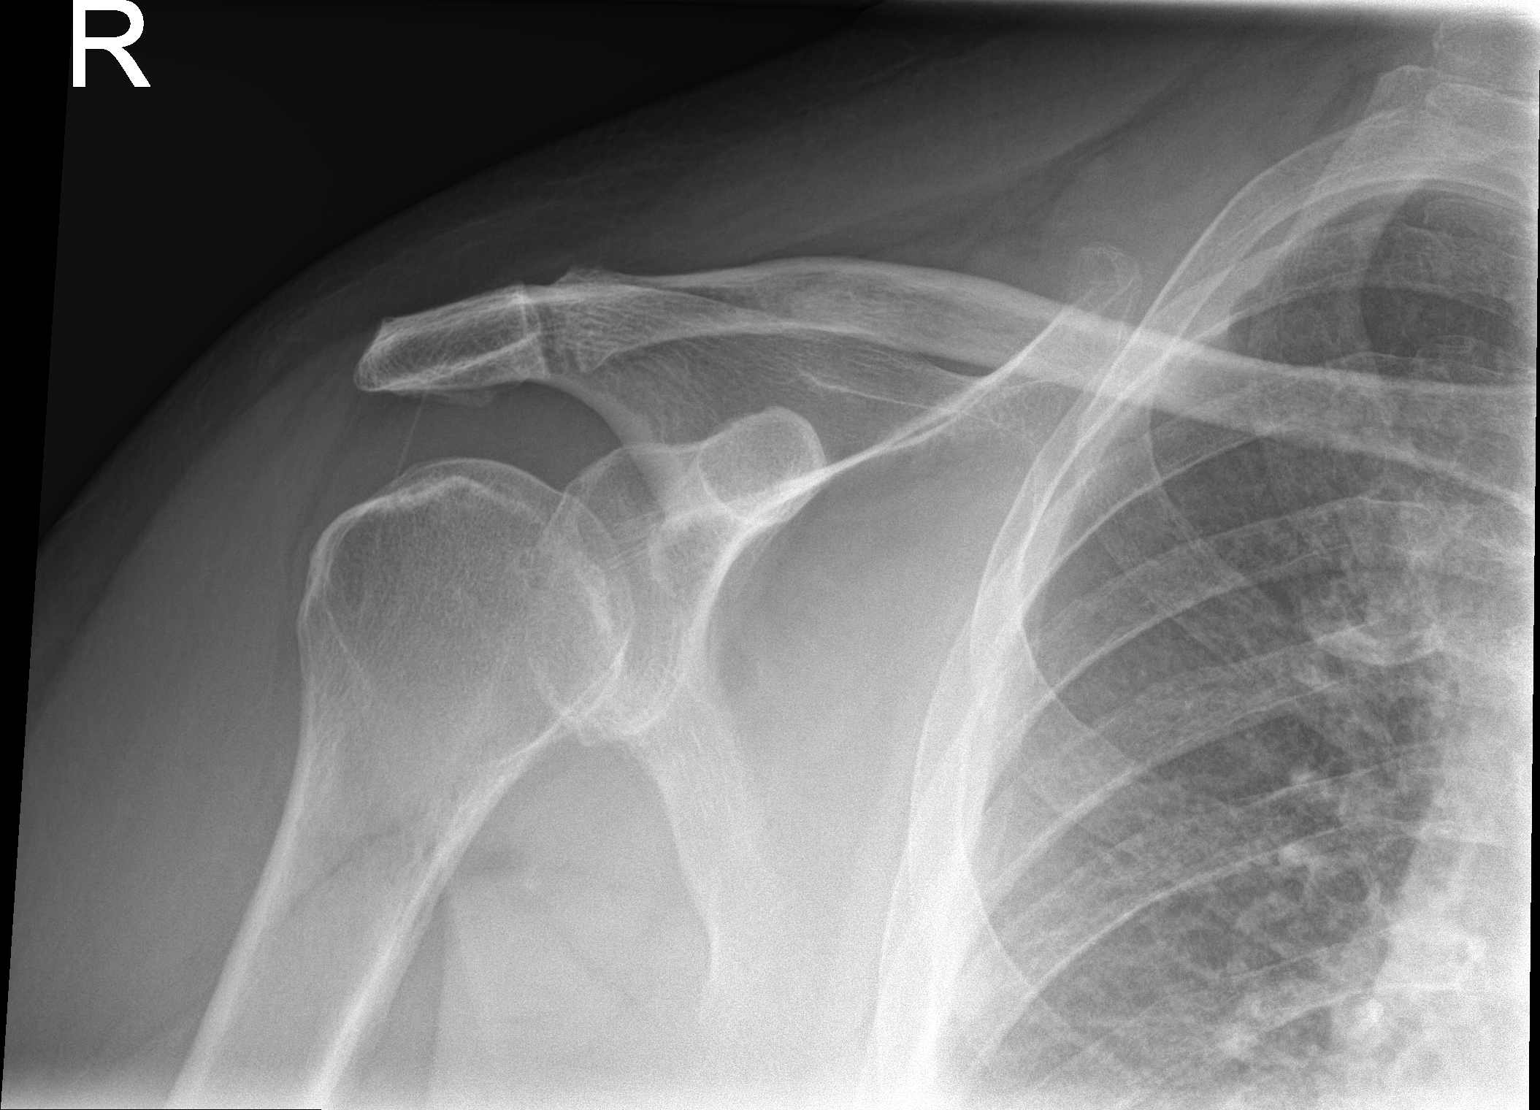

[shoulder axial]
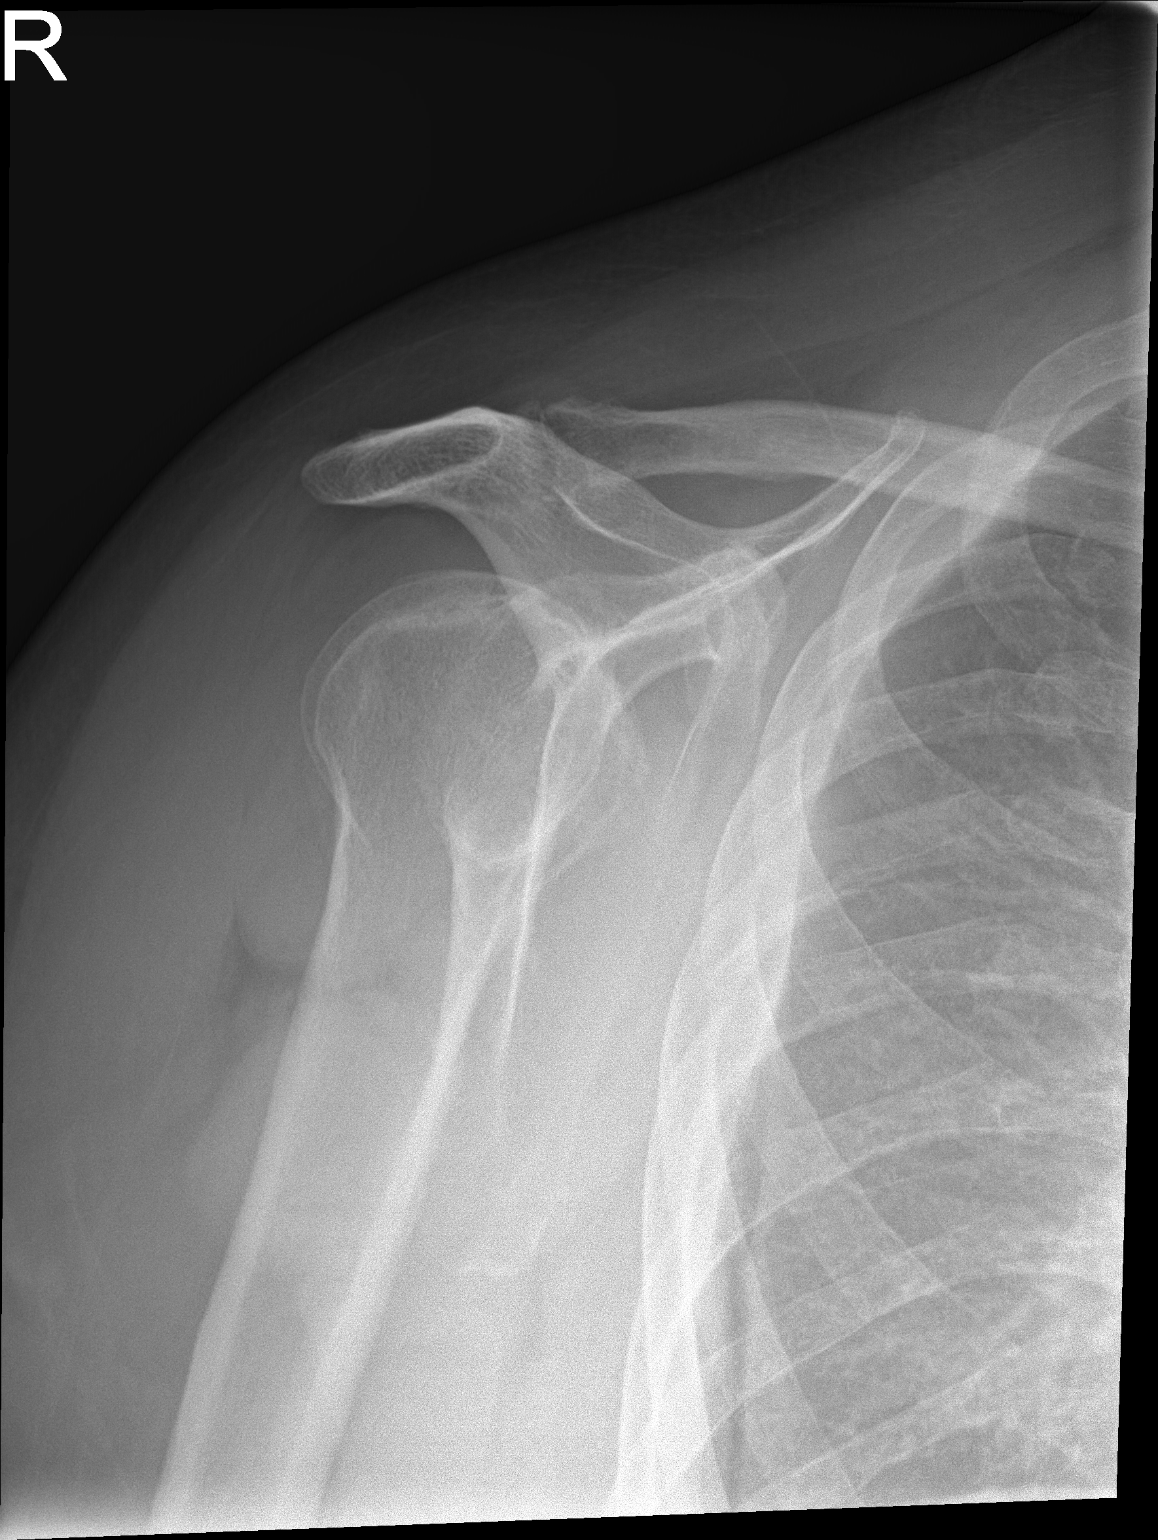

[3 of 3 positions shown; findings below may reference images not displayed]

FINDINGS: Mild degenerative changes of the acromioclavicular joint are again
noted. No acute fracture or dislocation is seen. The underlying bony
thorax is within normal limits.
IMPRESSION: No acute abnormality noted.

## 2019-05-14 ENCOUNTER — Ambulatory Visit: Payer: Self-pay | Attending: Internal Medicine
